# Patient Record
Sex: Male | Born: 1935 | Race: White | Hispanic: No | Marital: Married | Smoking: Never smoker
Health system: Southern US, Community
[De-identification: ages and names within clinical notes are randomized; demographics above are authoritative.]

## PROBLEM LIST (undated history)

## (undated) DIAGNOSIS — I10 Essential (primary) hypertension: Secondary | ICD-10-CM

## (undated) HISTORY — PX: KNEE ARTHROCENTESIS: SUR44

## (undated) HISTORY — PX: CHOLECYSTECTOMY: SHX55

---

## 2005-06-18 ENCOUNTER — Ambulatory Visit: Payer: Self-pay | Admitting: Unknown Physician Specialty

## 2005-08-27 ENCOUNTER — Ambulatory Visit: Payer: Self-pay | Admitting: Ophthalmology

## 2005-09-08 ENCOUNTER — Ambulatory Visit: Payer: Self-pay | Admitting: Ophthalmology

## 2007-07-02 ENCOUNTER — Ambulatory Visit: Payer: Self-pay | Admitting: Family Medicine

## 2007-07-06 ENCOUNTER — Ambulatory Visit: Payer: Self-pay | Admitting: Family Medicine

## 2007-07-08 ENCOUNTER — Ambulatory Visit: Payer: Self-pay | Admitting: Surgery

## 2007-07-09 ENCOUNTER — Inpatient Hospital Stay: Payer: Self-pay | Admitting: Surgery

## 2007-07-09 ENCOUNTER — Other Ambulatory Visit: Payer: Self-pay

## 2007-07-13 ENCOUNTER — Other Ambulatory Visit: Payer: Self-pay

## 2007-07-18 ENCOUNTER — Other Ambulatory Visit: Payer: Self-pay

## 2007-07-27 ENCOUNTER — Ambulatory Visit: Payer: Self-pay | Admitting: Surgery

## 2007-08-02 ENCOUNTER — Ambulatory Visit: Payer: Self-pay | Admitting: Surgery

## 2007-08-04 ENCOUNTER — Ambulatory Visit: Payer: Self-pay | Admitting: Surgery

## 2007-08-10 ENCOUNTER — Ambulatory Visit: Payer: Self-pay | Admitting: Surgery

## 2008-08-24 ENCOUNTER — Ambulatory Visit: Payer: Self-pay | Admitting: Family Medicine

## 2008-09-20 ENCOUNTER — Ambulatory Visit: Payer: Self-pay | Admitting: Family Medicine

## 2008-12-05 IMAGING — US US RENAL KIDNEY
1 series · 17 of 21 positions shown · non-contrast
Comparison: none

REASON FOR EXAM: Acute Renal Failure
COMMENTS:

[Series 1: us renal kidney · 17 of 21 slices shown]
[im 1/21]
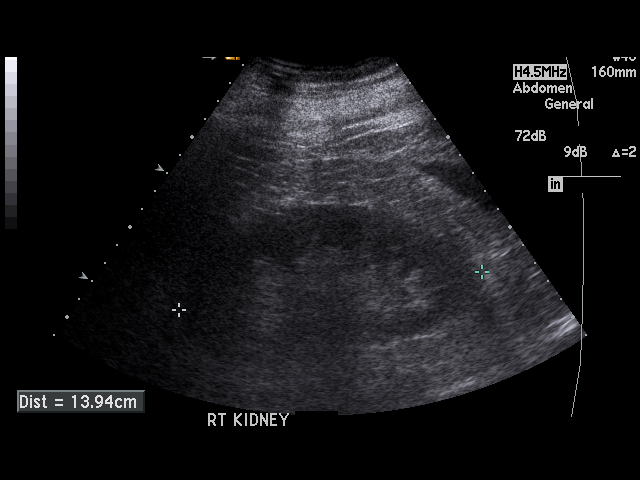
[im 2/21]
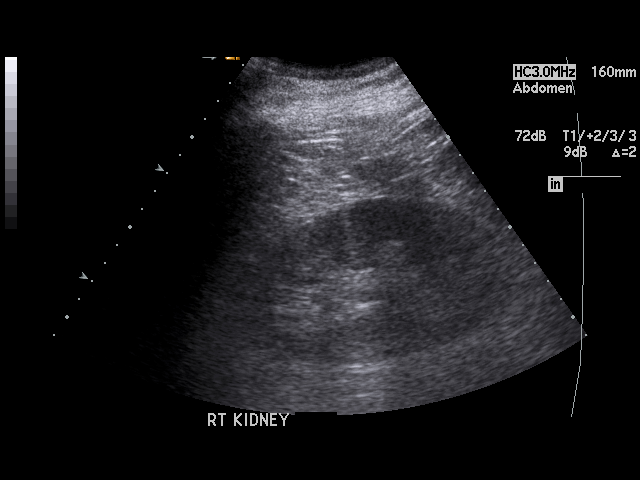
[im 4/21]
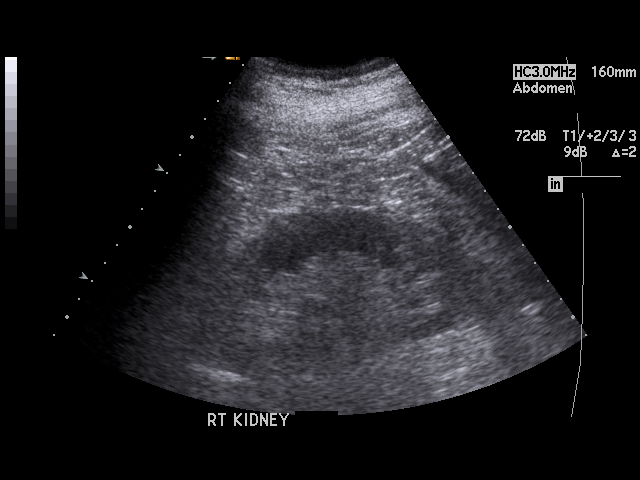
[im 5/21]
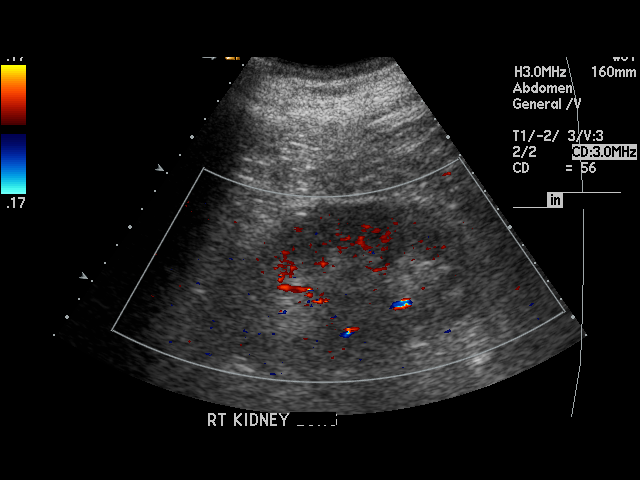
[im 6/21]
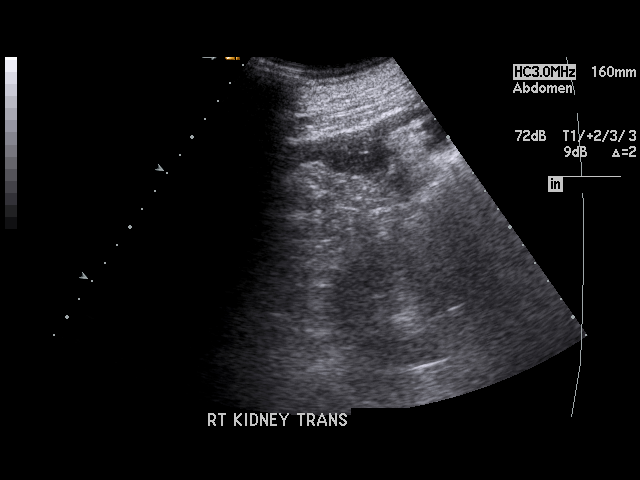
[im 7/21]
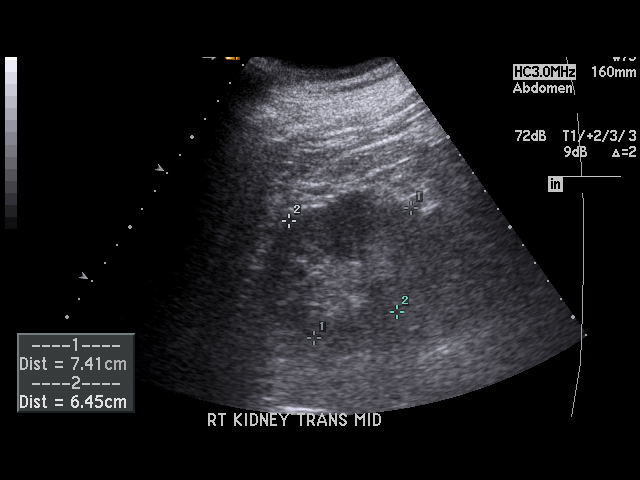
[im 9/21]
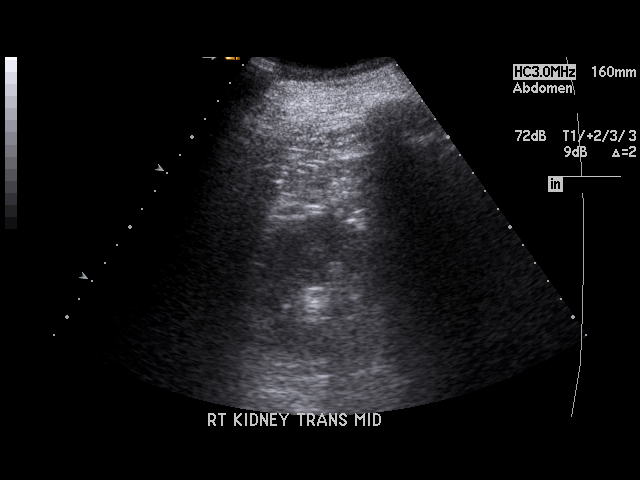
[im 10/21]
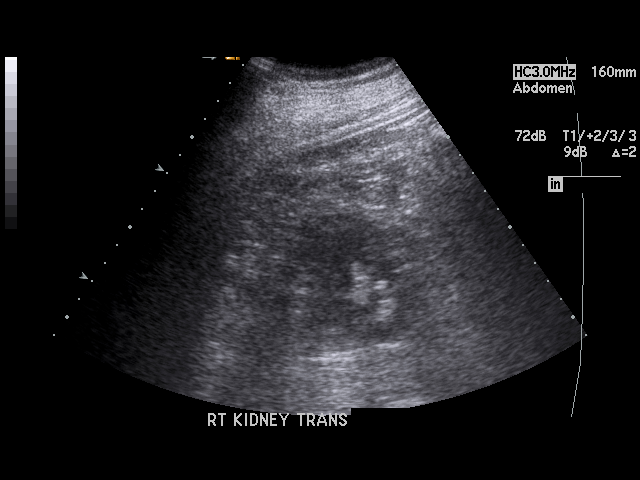
[im 11/21]
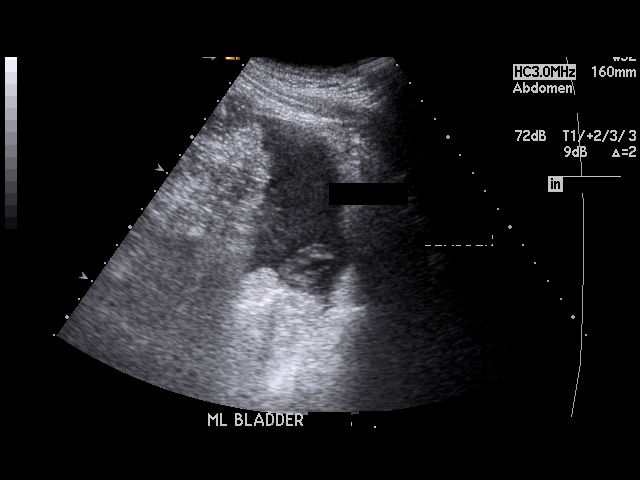
[im 12/21]
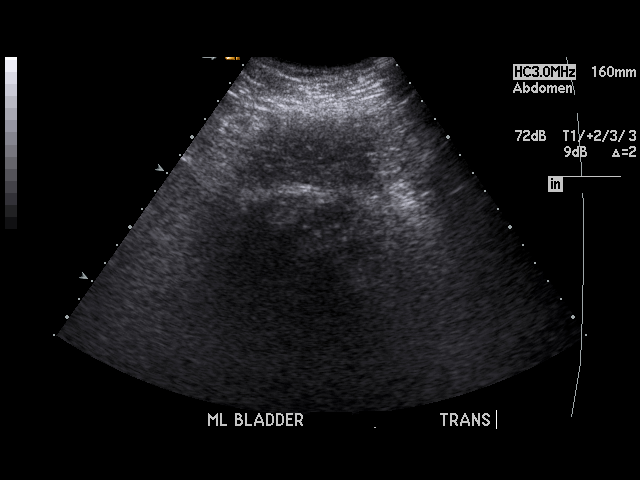
[im 13/21]
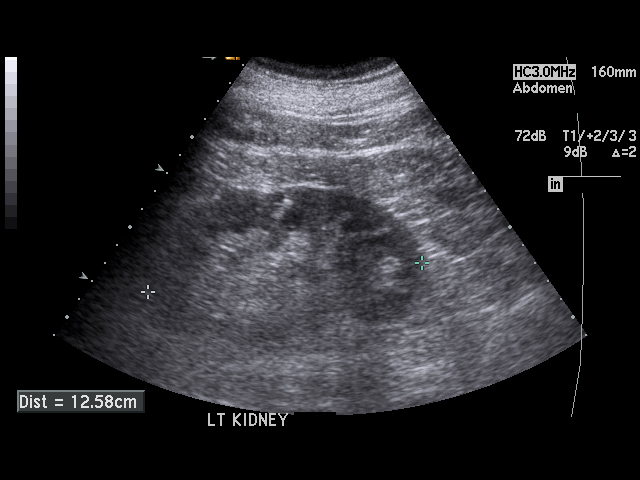
[im 15/21]
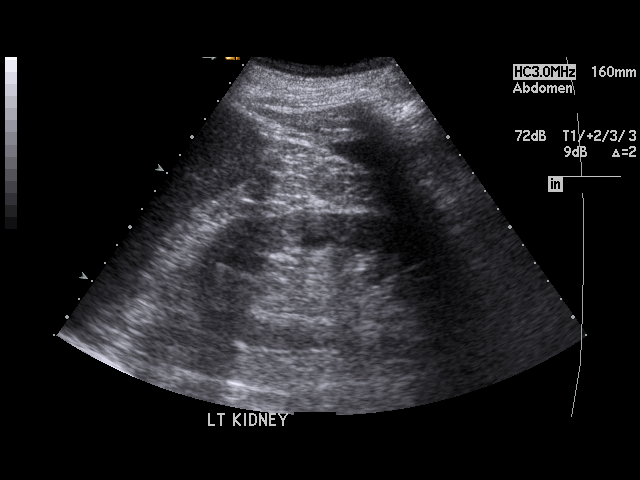
[im 16/21]
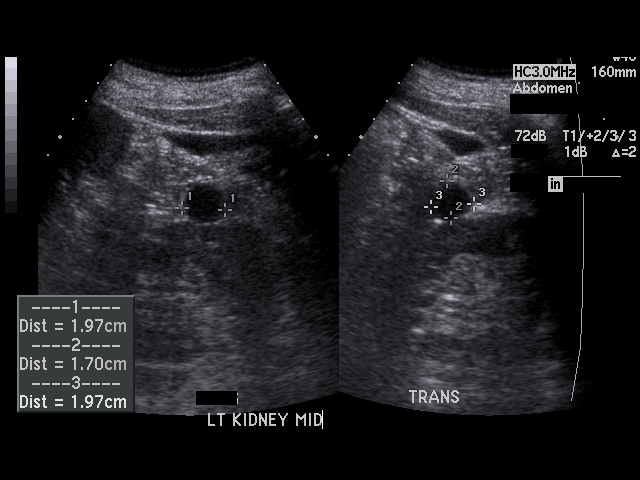
[im 17/21]
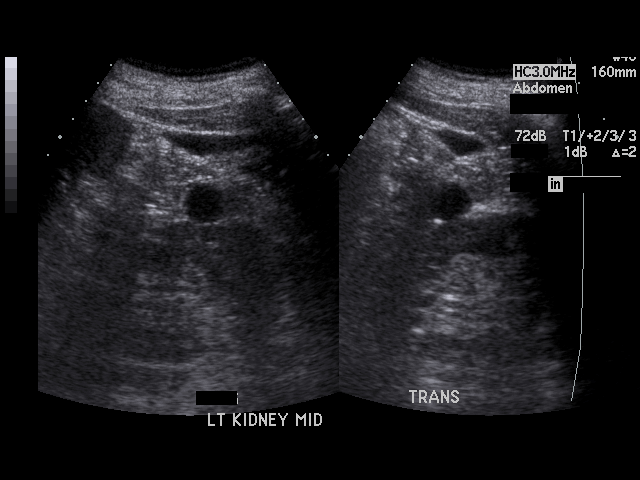
[im 18/21]
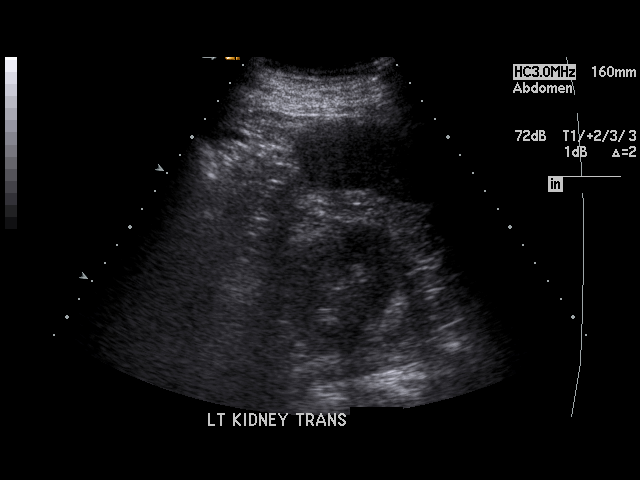
[im 20/21]
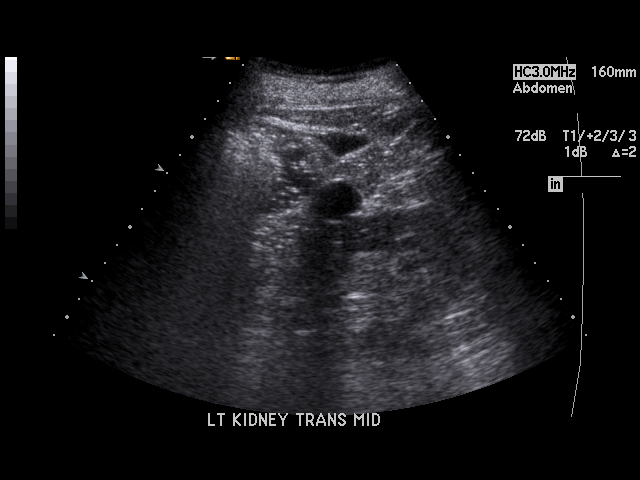
[im 21/21]
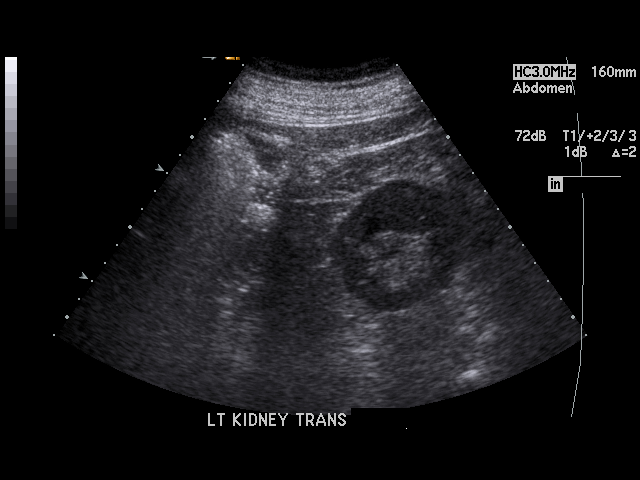

[17 of 21 positions shown; findings below may reference images not displayed]

PROCEDURE:     US  - US KIDNEY BILATERAL  - July 14, 2007  [DATE]

RESULT:     The RIGHT kidney measures 13.94 cm x 7.41 cm x 6.45 cm and the
LEFT kidney measures 12.58 cm x 6.58 cm x 7.09 cm.  The renal cortical
margins are smooth. No solid renal mass lesions are seen. There is a 1.97 cm
cyst of the mid pole region of the LEFT kidney. No renal calcifications are
noted. There is no hydronephrosis. There is observed fluid in the pelvis
above the urinary bladder consistent with ascites.
IMPRESSION: 1. No hydronephrosis or other acute change is identified.
2. There is a cyst of the LEFT kidney.
3. Ascites.

## 2008-12-26 IMAGING — CR DG CHEST 2V
1 series · 2 of 2 positions shown · non-contrast
Comparison: none

REASON FOR EXAM: Pneumothorax
COMMENTS:

[Series 1: view not recorded · 0.17mm/px · 2 of 2 slices shown]
[im 1/2]
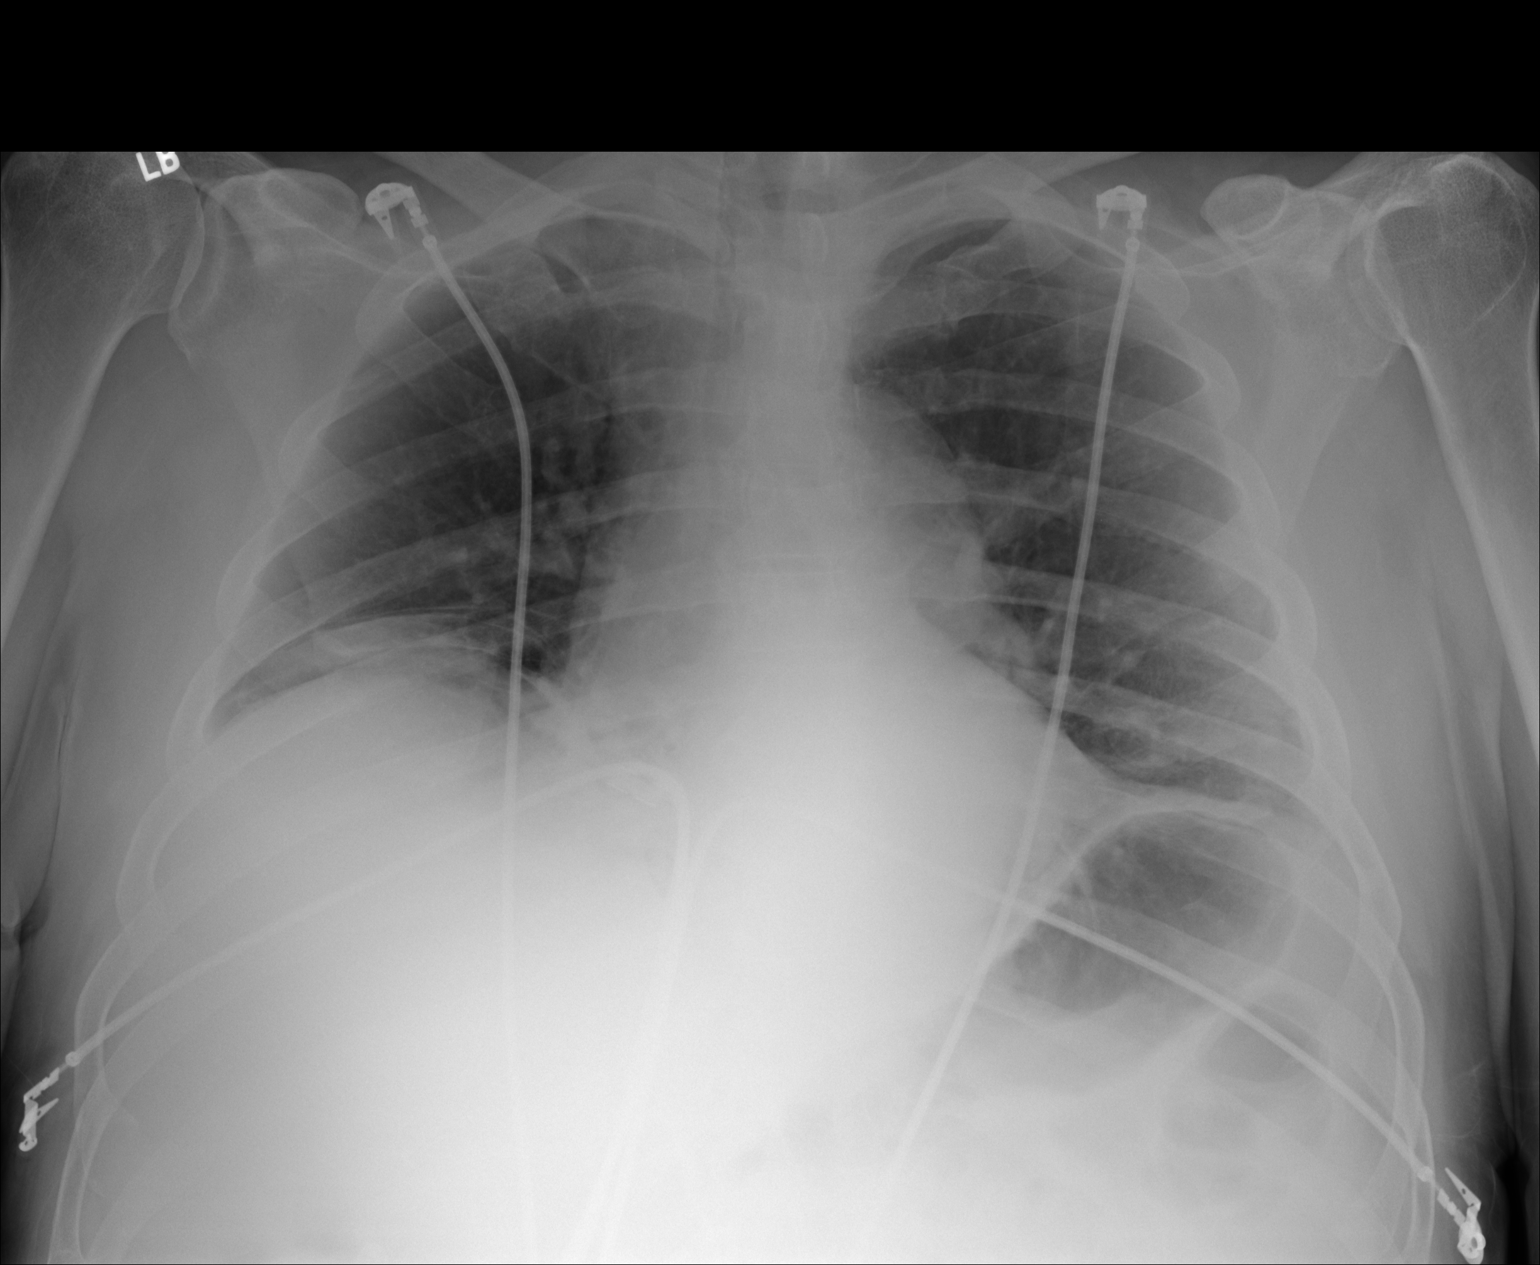
[im 2/2]
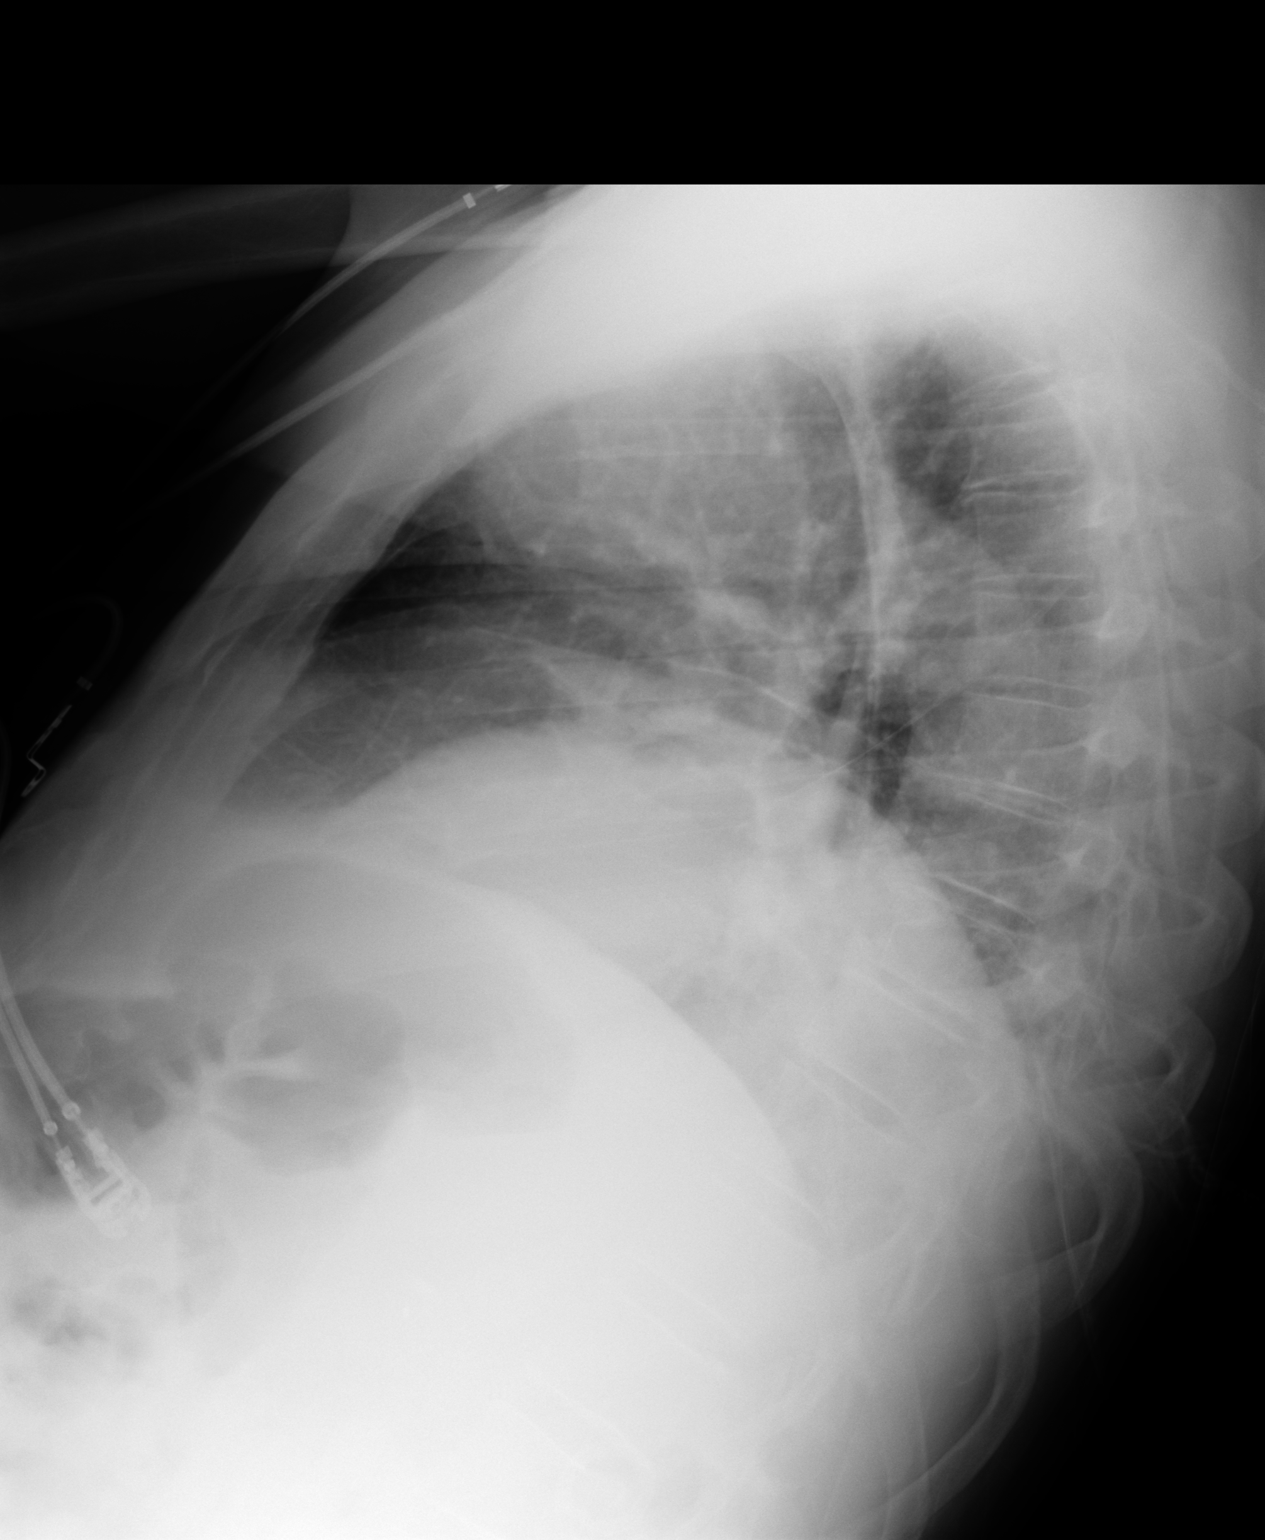

[2 of 2 positions shown; findings below may reference images not displayed]

PROCEDURE:     DXR - DXR CHEST PA (OR AP) AND LATERAL  - August 04, 2007  [DATE]

RESULT:     Comparison is made to a study of 08/04/2007 at approximately
[DATE]. The current exam was performed at [DATE].

The lung volumes remain low. There is atelectasis at the RIGHT lung base.
There is a rounded structure that could reflect a chest drainage tube at the
RIGHT lung base but I cannot see evidence of it extending outside of the
thorax. I do not see evidence of significant pneumothorax at this time.
IMPRESSION: There is no dramatic change in the appearance of the chest
since the study of earlier this same day. Density at the RIGHT lung base
could reflect a subpulmonic pneumothorax or the presence of a chest tube
that is only partially imaged. Correlation clinically is needed. Again,
there has not been dramatic change since the earlier study.

## 2008-12-27 IMAGING — CR DG CHEST 2V
1 series · 2 of 2 positions shown · non-contrast
Comparison: none

REASON FOR EXAM: pneumothorax
COMMENTS:

[Series 1: view not recorded · 0.17mm/px · 2 of 2 slices shown]
[im 1/2]
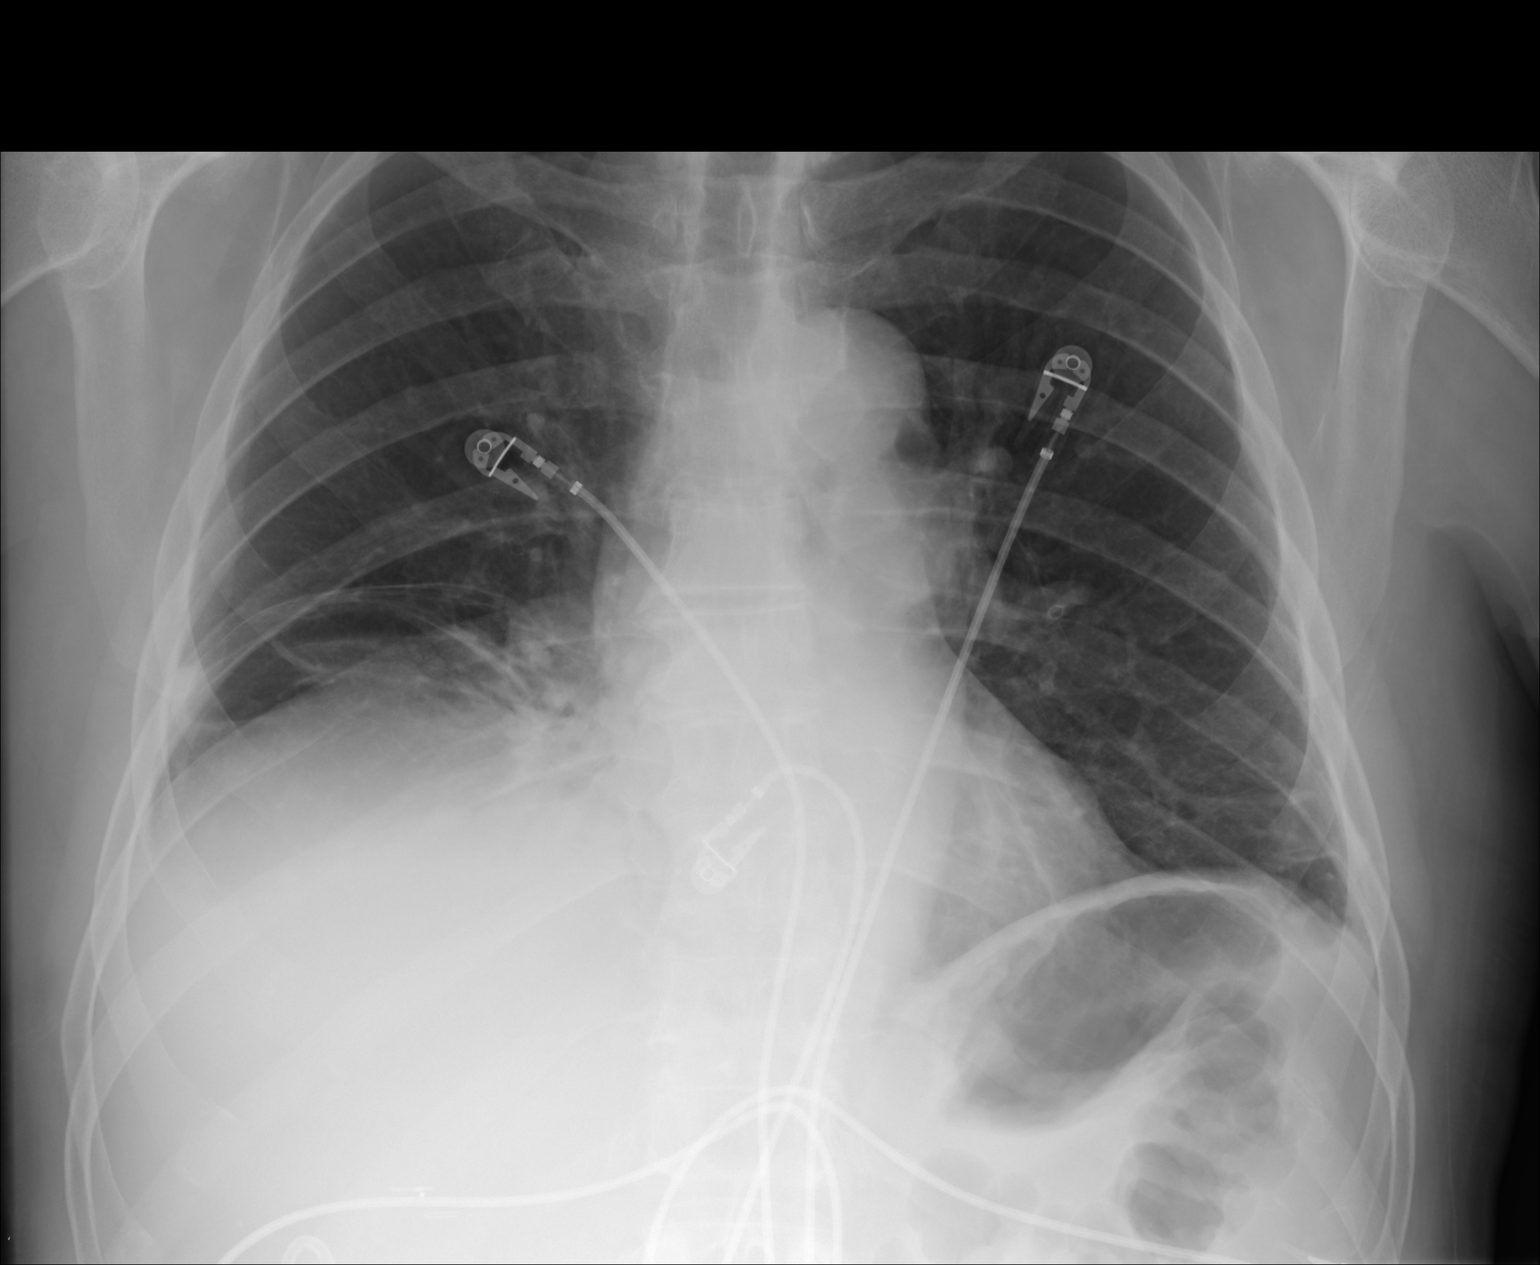
[im 2/2]
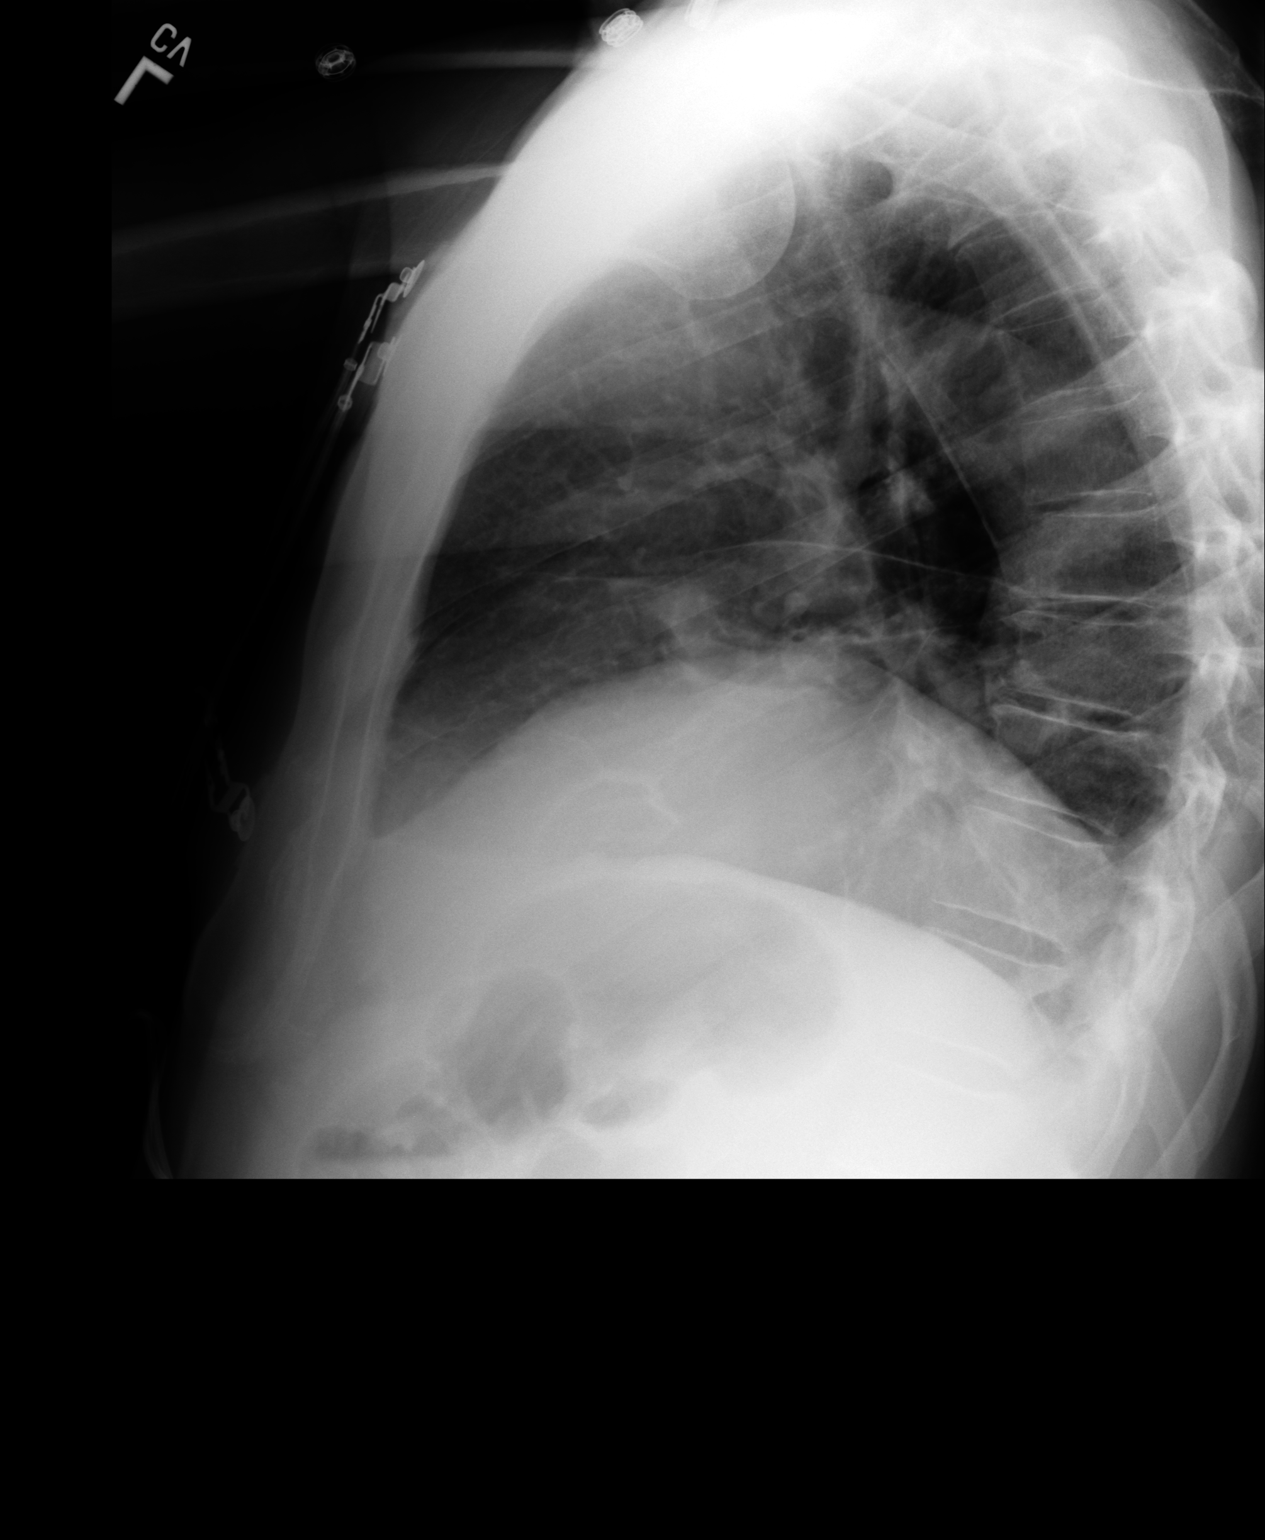

[2 of 2 positions shown; findings below may reference images not displayed]

PROCEDURE:     DXR - DXR CHEST PA (OR AP) AND LATERAL  - August 05, 2007 [DATE]

RESULT:     Comparison is made to the prior exam of 08/04/2007.  There are
transverse bands of increased density at the RIGHT base consistent with
atelectasis. Minimal discoid atelectasis is also seen at the LEFT
costophrenic angle. No pneumothorax is observed. No pleural effusion is
seen. Heart size is within normal limits. Incidental note is made of mild
elevation of the RIGHT hemidiaphragm.
IMPRESSION: 1. No pneumothorax is seen.
2. Atelectatic changes are noted at the lung bases bilaterally, more
prominent on the RIGHT.

## 2010-11-14 ENCOUNTER — Ambulatory Visit: Payer: Self-pay | Admitting: Internal Medicine

## 2011-06-10 ENCOUNTER — Ambulatory Visit: Payer: Self-pay | Admitting: Ophthalmology

## 2015-09-06 ENCOUNTER — Encounter: Payer: Self-pay | Admitting: *Deleted

## 2015-09-07 ENCOUNTER — Ambulatory Visit: Payer: Medicare Other | Admitting: Anesthesiology

## 2015-09-07 ENCOUNTER — Ambulatory Visit
Admission: RE | Admit: 2015-09-07 | Discharge: 2015-09-07 | Disposition: A | Discharge status: Home / Self Care | Payer: Medicare Other | Source: Ambulatory Visit | Attending: Unknown Physician Specialty | Admitting: Unknown Physician Specialty

## 2015-09-07 ENCOUNTER — Encounter
Admission: RE | Disposition: A | Discharge status: Home / Self Care | Payer: Self-pay | Source: Ambulatory Visit | Attending: Unknown Physician Specialty

## 2015-09-07 ENCOUNTER — Encounter: Payer: Self-pay | Admitting: Anesthesiology

## 2015-09-07 DIAGNOSIS — Z1211 Encounter for screening for malignant neoplasm of colon: Secondary | ICD-10-CM | POA: Diagnosis not present

## 2015-09-07 DIAGNOSIS — K648 Other hemorrhoids: Secondary | ICD-10-CM | POA: Insufficient documentation

## 2015-09-07 DIAGNOSIS — K5730 Diverticulosis of large intestine without perforation or abscess without bleeding: Secondary | ICD-10-CM | POA: Insufficient documentation

## 2015-09-07 HISTORY — PX: COLONOSCOPY WITH PROPOFOL: SHX5780

## 2015-09-07 SURGERY — COLONOSCOPY WITH PROPOFOL
Anesthesia: General

## 2015-09-07 MED ORDER — PROPOFOL 10 MG/ML IV BOLUS
INTRAVENOUS | Status: DC | PRN
  Administered 2015-09-07: 20 mg via INTRAVENOUS
  Administered 2015-09-07: 30 mg via INTRAVENOUS

## 2015-09-07 MED ORDER — FENTANYL CITRATE (PF) 100 MCG/2ML IJ SOLN
INTRAMUSCULAR | Status: DC | PRN
  Administered 2015-09-07: 50 ug via INTRAVENOUS

## 2015-09-07 MED ORDER — LIDOCAINE HCL (PF) 1 % IJ SOLN
2.0000 mL | Freq: Once | INTRAMUSCULAR | Status: AC
  Administered 2015-09-07: 0.2 mL via INTRADERMAL

## 2015-09-07 MED ORDER — PROPOFOL 500 MG/50ML IV EMUL
INTRAVENOUS | Status: DC | PRN
  Administered 2015-09-07: 140 ug/kg/min via INTRAVENOUS

## 2015-09-07 MED ORDER — LIDOCAINE HCL (PF) 1 % IJ SOLN
INTRAMUSCULAR | Status: AC
  Administered 2015-09-07: 0.2 mL via INTRADERMAL
  Filled 2015-09-07: qty 2

## 2015-09-07 MED ORDER — SODIUM CHLORIDE 0.9 % IV SOLN: INTRAVENOUS | Status: DC

## 2015-09-07 MED ORDER — LIDOCAINE HCL (PF) 2 % IJ SOLN
INTRAMUSCULAR | Status: DC | PRN
  Administered 2015-09-07: 50 mg

## 2015-09-07 MED ORDER — SODIUM CHLORIDE 0.9 % IV SOLN
INTRAVENOUS | Status: DC
Start: 2015-09-07 — End: 2015-09-07
  Administered 2015-09-07: 1000 mL via INTRAVENOUS

## 2015-09-07 MED ORDER — PHENYLEPHRINE HCL 10 MG/ML IJ SOLN
INTRAMUSCULAR | Status: DC | PRN
  Administered 2015-09-07 (×2): 100 ug via INTRAVENOUS
  Administered 2015-09-07: 200 ug via INTRAVENOUS
  Administered 2015-09-07: 100 ug via INTRAVENOUS

## 2015-09-07 NOTE — Op Note (Signed)
[facility]ology Patient Name: Chase CasterRobert Brown Procedure Date: 09/07/2015 10:02 AM MRN: [id number] Account #: [id number] Date of Birth: Jan 11, 1935 Admit Type: Outpatient Age: [age] [facility] Gender: Male Note Status: Finalized Procedure:         Colonoscopy Indications:       Screening for colorectal malignant neoplasm Providers:         Scot Junobert T. Nesha Counihan, MD Referring MD:      Georgeann OppenheimPc Tobin, MD (Referring MD) Medicines:         Propofol per Anesthesia Complications:     No immediate complications. Procedure:         Pre-Anesthesia Assessment:                    - After reviewing the risks and benefits, the patient was                     deemed in satisfactory condition to undergo the procedure.                    After obtaining informed consent, the colonoscope was                     passed under direct vision. Throughout the procedure, the                     patient's blood pressure, pulse, and oxygen saturations                     were monitored continuously. The Colonoscope was                     introduced through the anus and advanced to the the cecum,                     identified by appendiceal orifice and ileocecal valve. The                     colonoscopy was performed without difficulty. The patient                     tolerated the procedure well. The quality of the bowel                     preparation was good. Findings:      Internal hemorrhoids were found during endoscopy. The hemorrhoids were       small and medium-sized.      Multiple small-mouthed diverticula were found in the sigmoid colon and       in the descending colon.      The exam was otherwise without abnormality. Impression:        - Internal hemorrhoids.                    - No specimens collected. Recommendation:    - The findings and recommendations were discussed with the                     patient. No repeat exam recommended Scot Junobert T Corrine Tillis,  MD 09/07/2015 10:42:58 AM This report has been signed electronically. Number of Addenda: 0 Note Initiated On: 09/07/2015 10:02 AM Scope Withdrawal Time: 0 hours 8 minutes 19 seconds  Total Procedure Duration: 0 hours 15 minutes 51 seconds       [facility]

## 2015-09-07 NOTE — Anesthesia Postprocedure Evaluation (Signed)
  Anesthesia Post-op Note  Patient: Chase HaffRobert M Brown  Procedure(s) Performed: Procedure(s): COLONOSCOPY WITH PROPOFOL (N/A)  Anesthesia type:General  Patient location: PACU  Post pain: Pain level controlled  Post assessment: Post-op Vital signs reviewed, Patient's Cardiovascular Status Stable, Respiratory Function Stable, Patent Airway and No signs of Nausea or vomiting  Post vital signs: Reviewed and stable  Last Vitals:  Filed Vitals:   09/07/15 1115  BP: 100/83  Pulse: 75  Temp:   Resp: 17    Level of consciousness: awake, alert  and patient cooperative  Complications: No apparent anesthesia complications

## 2015-09-07 NOTE — H&P (Signed)
   Primary Care Physician:  Delton Prairieobin, Paul, MD Primary Gastroenterologist:  Dr. Mechele CollinElliott  Pre-Procedure History & Physical: HPI:  Chase Brown is a 80 y.o. male is here for an colonoscopy.   No past medical history on file.  Past Surgical History  Procedure Laterality Date  . Cholecystectomy    . Knee arthrocentesis      Prior to Admission medications   Medication Sig Start Date End Date Taking? Authorizing Provider  cephALEXin (KEFLEX) 500 MG capsule Take 500 mg by mouth 4 (four) times daily.   Yes Historical Provider, MD  HYDROcodone-acetaminophen (NORCO/VICODIN) 5-325 MG tablet Take 1 tablet by mouth every 6 (six) hours as needed for moderate pain.   Yes Historical Provider, MD  valsartan (DIOVAN) 160 MG tablet Take 160 mg by mouth daily.   Yes Historical Provider, MD    Allergies as of 06/22/2015  . (Not on File)    No family history on file.  Social History   Social History  . Marital Status: Married    Spouse Name: N/A  . Number of Children: N/A  . Years of Education: N/A   Occupational History  . Not on file.   Social History Main Topics  . Smoking status: Not on file  . Smokeless tobacco: Not on file  . Alcohol Use: Not on file  . Drug Use: Not on file  . Sexual Activity: Not on file   Other Topics Concern  . Not on file   Social History Narrative  . No narrative on file    Review of Systems: See HPI, otherwise negative ROS  Physical Exam: BP 118/91 mmHg  Pulse 90  Temp(Src) 98.2 F (36.8 C) (Tympanic)  Resp 17  Ht 6\' 1"  (1.854 m)  Wt 102.059 kg (225 lb)  BMI 29.69 kg/m2  SpO2 98% General:   Alert,  pleasant and cooperative in NAD Head:  Normocephalic and atraumatic. Neck:  Supple; no masses or thyromegaly. Lungs:  Clear throughout to auscultation.    Heart:  Regular rate and rhythm. Abdomen:  Soft, nontender and nondistended. Normal bowel sounds, without guarding, and without rebound.   Neurologic:  Alert and  oriented x4;  grossly normal  neurologically.  Impression/Plan: Chase Brown is here for an colonoscopy to be performed for Screening colon  Risks, benefits, limitations, and alternatives regarding  colonoscopy have been reviewed with the patient.  Questions have been answered.  All parties agreeable.   Lynnae PrudeELLIOTT, Dannel, MD  09/07/2015, 10:00 AM

## 2015-09-07 NOTE — Anesthesia Preprocedure Evaluation (Signed)
Anesthesia Evaluation  Patient identified by MRN, date of birth, ID band Patient awake    Reviewed: Allergy & Precautions, H&P , NPO status , Patient's Chart, lab work & pertinent test results  History of Anesthesia Complications Negative for: history of anesthetic complications  Airway Mallampati: II  TM Distance: >3 FB Neck ROM: full    Dental  (+) Poor Dentition, Chipped, Missing, Implants   Pulmonary neg pulmonary ROS, neg shortness of breath,    Pulmonary exam normal breath sounds clear to auscultation       Cardiovascular Exercise Tolerance: Good (-) angina(-) Past MI and (-) DOE negative cardio ROS Normal cardiovascular exam Rhythm:regular Rate:Normal     Neuro/Psych negative neurological ROS  negative psych ROS   GI/Hepatic negative GI ROS, Neg liver ROS, neg GERD  ,  Endo/Other  negative endocrine ROS  Renal/GU negative Renal ROS  negative genitourinary   Musculoskeletal negative musculoskeletal ROS (+)   Abdominal   Peds negative pediatric ROS (+)  Hematology negative hematology ROS (+)   Anesthesia Other Findings History reviewed. No pertinent past medical history.  Past Surgical History:   CHOLECYSTECTOMY                                               KNEE ARTHROCENTESIS                                          BMI    Body Mass Index   29.69 kg/m 2    Signs and symptoms suggestive of sleep apnea     Reproductive/Obstetrics negative OB ROS                             Anesthesia Physical Anesthesia Plan  ASA: II  Anesthesia Plan: General   Post-op Pain Management:    Induction:   Airway Management Planned:   Additional Equipment:   Intra-op Plan:   Post-operative Plan:   Informed Consent: I have reviewed the patients History and Physical, chart, labs and discussed the procedure including the risks, benefits and alternatives for the proposed anesthesia  with the patient or authorized representative who has indicated his/her understanding and acceptance.   Dental Advisory Given  Plan Discussed with: Anesthesiologist, CRNA and Surgeon  Anesthesia Plan Comments:         Anesthesia Quick Evaluation

## 2015-09-07 NOTE — Transfer of Care (Signed)
Immediate Anesthesia Transfer of Care Note  Patient: Chase Brown  Procedure(s) Performed: Procedure(s): COLONOSCOPY WITH PROPOFOL (N/A)  Patient Location: PACU  Anesthesia Type:General  Level of Consciousness: sedated  Airway & Oxygen Therapy: Patient Spontanous Breathing and Patient connected to nasal cannula oxygen  Post-op Assessment: Report given to RN and Post -op Vital signs reviewed and stable  Post vital signs: Reviewed and stable  Last Vitals:  Filed Vitals:   09/07/15 1045  BP: 84/61  Pulse: 47  Temp:   Resp: 15    Complications: No apparent anesthesia complications

## 2023-01-26 ENCOUNTER — Ambulatory Visit
Admission: EM | Admit: 2023-01-26 | Discharge: 2023-01-26 | Disposition: A | Discharge status: Home / Self Care | Payer: Medicare Other

## 2023-01-26 DIAGNOSIS — S61512A Laceration without foreign body of left wrist, initial encounter: Secondary | ICD-10-CM | POA: Diagnosis not present

## 2023-01-26 HISTORY — DX: Essential (primary) hypertension: I10

## 2023-01-26 NOTE — Discharge Instructions (Signed)
-  You have had stitches today as well as a small portion of the laceration was closed with skin adhesive -Do not get area wet for 48 hours and then clean gently with soap and water. Try your best not to get the part with skin adhesive wet.  -Change bandage daily -Try to avoid too much movement of the wrist or it could stretch/pop the stitches -Return in 10 days for suture removal or sooner if there are any signs of infection--redness, increased swelling, pustular drainage.

## 2023-01-26 NOTE — ED Triage Notes (Signed)
Patient with left wrist laceration after changing tire this morning. States one of the straps flew back on him and cut his wrist.

## 2023-01-26 NOTE — ED Provider Notes (Signed)
[facility]    CSN: [id number] Arrival date & time: 01/26/23  0955      History   Chief Complaint Chief Complaint  Patient presents with   Laceration    HPI Chase Brown is a 88 y.o. male presenting for laceration of the left wrist that occurred immediately before arrival to urgent care. He says he cut it on something when he was working on a car, changing his tire. He wrapped the wrist up with paper towels and electrical tape as well as scotch tape. Reports ongoing bleeding. Full ROM of wrist. No numbness or weakness. Tetanus updated this year already. Currently on antibiotics for dental issue. No other injuries or concerns.  HPI  Past Medical History:  Diagnosis Date   Hypertension     There are no problems to display for this patient.   Past Surgical History:  Procedure Laterality Date   CHOLECYSTECTOMY     COLONOSCOPY WITH PROPOFOL N/A 09/07/2015   Procedure: COLONOSCOPY WITH PROPOFOL;  Surgeon: Scot Jun, MD;  [facility] END[facility]COPY;  Service: Endoscopy;  Laterality: N/A;   KNEE ARTHROCENTESIS         Home Medications    Prior to Admission medications   Medication Sig Start Date End Date Taking? Authorizing Provider  losartan (COZAAR) 100 MG tablet Take 1 tablet by mouth daily. 05/13/22 05/13/23 Yes [provider]  cephALEXin (KEFLEX) 500 MG capsule Take 500 mg by mouth 4 (four) times daily.    [provider]  HYDROcodone-acetaminophen (NORCO/VICODIN) 5-325 MG tablet Take 1 tablet by mouth every 6 (six) hours as needed for moderate pain.    [provider]    Family History No family history on file.  Social History Social History   Tobacco Use   Smoking status: Never  Vaping Use   Vaping Use: Never used     Allergies   Patient has no known allergies.   Review of Systems Review of Systems  Musculoskeletal:  Negative for arthralgias and joint swelling.  Skin:  Positive for wound. Negative for  color change.  Neurological:  Negative for weakness and numbness.     Physical Exam Triage Vital Signs ED Triage Vitals  Enc Vitals Group     BP      Pulse      Resp      Temp      Temp src      SpO2      Weight      Height      Head Circumference      Peak Flow      Pain Score      Pain Loc      Pain Edu?      Excl. in GC?    No data found.  Updated Vital Signs BP (!) 177/95 (BP Location: Right Arm)   Pulse (!) 102   Temp 98 F (36.7 C) (Oral)   Resp 18   SpO2 97%      Physical Exam Vitals and nursing note reviewed.  Constitutional:      General: He is not in acute distress.    Appearance: Normal appearance. He is well-developed. He is not ill-appearing.  HENT:     Head: Normocephalic and atraumatic.  Eyes:     General: No scleral icterus.    Conjunctiva/sclera: Conjunctivae normal.  Cardiovascular:     Rate and Rhythm: Normal rate and regular rhythm.     Pulses: Normal pulses.  Pulmonary:  Effort: Pulmonary effort is normal. No respiratory distress.     Breath sounds: Normal breath sounds.  Musculoskeletal:     Cervical back: Neck supple.  Skin:    General: Skin is warm and dry.     Capillary Refill: Capillary refill takes less than 2 seconds.     Findings: Laceration present. Petechiae: 5 cm laceration left wrist with exposed fatty tissue. Good pulses. Full ROM of wrist. Neurological:     General: No focal deficit present.     Mental Status: He is alert. Mental status is at baseline.     Motor: No weakness.     Gait: Gait normal.  Psychiatric:        Mood and Affect: Mood normal.        Behavior: Behavior normal.      UC Treatments / Results  Labs (all labs ordered are listed, but only abnormal results are displayed) Labs Reviewed - No data to display  EKG   Radiology No results found.  Procedures Laceration Repair  Date/Time: 01/26/2023 12:28 PM  Performed by: Shirlee Latch, PA-C Authorized by: Shirlee Latch, PA-C    Consent:    Consent obtained:  Verbal   Consent given by:  Patient   Risks discussed:  Infection, pain, retained foreign body, poor cosmetic result and poor wound healing   Alternatives discussed:  No treatment, delayed treatment and observation Universal protocol:    Patient identity confirmed:  Verbally with patient Anesthesia:    Anesthesia method:  Local infiltration   Local anesthetic:  Lidocaine 1% w/o epi Laceration details:    Location:  Hand   Hand location:  L wrist   Length (cm):  5 Pre-procedure details:    Preparation:  Patient was prepped and draped in usual sterile fashion Exploration:    Hemostasis achieved with:  Direct pressure   Imaging outcome: foreign body not noted     Wound exploration: wound explored through full range of motion and entire depth of wound visualized     Wound extent: no foreign bodies/material noted, no muscle damage noted and no tendon damage noted     Contaminated: no   Treatment:    Area cleansed with:  Saline   Amount of cleaning:  Extensive   Irrigation solution:  Sterile saline   Visualized foreign bodies/material removed: no     Debridement:  Minimal Skin repair:    Repair method:  Sutures and tissue adhesive   Suture size:  3-0   Suture technique:  Simple interrupted   Number of sutures:  6 Approximation:    Approximation:  Close Repair type:    Repair type:  Simple Post-procedure details:    Dressing:  Non-adherent dressing   Procedure completion:  Tolerated well, no immediate complications  (including critical care time)  Medications Ordered in UC Medications - No data to display  Initial Impression / Assessment and Plan / UC Course  I have reviewed the triage vital signs and the nursing notes.  Pertinent labs & imaging results that were available during my care of the patient were reviewed by me and considered in my medical decision making (see chart for details).   88 year old male presenting for laceration of  left wrist.  While he was changing a tire strap popped back and cut him on his wrist.  He says he was wearing gloves.  He has agreed to repair of the laceration which is 5 inches of the lateral wrist.  Area flushed thoroughly with  saline.  No foreign bodies.  Full range of motion.  No damage to muscles or tendons.  No numbness or weakness.  Closed wound with 3-0 simple interrupted sutures. Placed 6 sutures.  There was a small area near the middle of the wound with a tiny flap of tissue.  Use skin adhesive to close this area.  Applied nonadherent pad and Coban.  Discussed wound care.  Reviewed returning in 10 days to have sutures removed or sooner for any signs of infection.  Advised to complete the antibiotics he is currently taking.  This should prevent any infection but if he notices signs of infection he is to return sooner.  Supportive care advised.  Advised to try not to use this hand/wrist much   Final Clinical Impressions(s) / UC Diagnoses   Final diagnoses:  Laceration of left wrist, initial encounter     Discharge Instructions      -You have had stitches today as well as a small portion of the laceration was closed with skin adhesive -Do not get area wet for 48 hours and then clean gently with soap and water. Try your best not to get the part with skin adhesive wet.  -Change bandage daily -Try to avoid too much movement of the wrist or it could stretch/pop the stitches -Return in 10 days for suture removal or sooner if there are any signs of infection--redness, increased swelling, pustular drainage.      ED Prescriptions   None    PDMP not reviewed this encounter.   Shirlee Latchaves, Arris Meyn B, PA-C 01/26/23 1233

## 2023-02-09 ENCOUNTER — Ambulatory Visit
Admission: EM | Admit: 2023-02-09 | Discharge: 2023-02-09 | Disposition: A | Discharge status: Home / Self Care | Payer: Medicare Other

## 2023-02-09 DIAGNOSIS — Z4802 Encounter for removal of sutures: Secondary | ICD-10-CM

## 2023-02-09 NOTE — ED Triage Notes (Signed)
Pt presents for suture removal for laceration on 01/26/23.
